# Patient Record
Sex: Male | Born: 1990 | Race: White | Hispanic: No | Marital: Single | Smoking: Never smoker
Health system: Southern US, Community
[De-identification: ages and names within clinical notes are randomized; demographics above are authoritative.]

## PROBLEM LIST (undated history)

## (undated) DIAGNOSIS — E669 Obesity, unspecified: Secondary | ICD-10-CM

## (undated) DIAGNOSIS — R569 Unspecified convulsions: Secondary | ICD-10-CM

## (undated) HISTORY — PX: BRAIN SURGERY: SHX531

## (undated) HISTORY — PX: TONSILLECTOMY: SUR1361

## (undated) HISTORY — PX: CHOLECYSTECTOMY: SHX55

## (undated) HISTORY — PX: LAPAROSCOPIC GASTRIC BANDING: SHX1100

---
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
## 2016-09-01 ENCOUNTER — Encounter (HOSPITAL_BASED_OUTPATIENT_CLINIC_OR_DEPARTMENT_OTHER): Payer: Self-pay | Admitting: *Deleted

## 2016-09-01 ENCOUNTER — Emergency Department (HOSPITAL_BASED_OUTPATIENT_CLINIC_OR_DEPARTMENT_OTHER)
Admission: EM | Admit: 2016-09-01 | Discharge: 2016-09-02 | Disposition: A | Discharge status: Home / Self Care | Payer: Medicaid Other | Attending: Emergency Medicine | Admitting: Emergency Medicine

## 2016-09-01 DIAGNOSIS — M25551 Pain in right hip: Secondary | ICD-10-CM | POA: Diagnosis present

## 2016-09-01 DIAGNOSIS — M67351 Transient synovitis, right hip: Secondary | ICD-10-CM | POA: Insufficient documentation

## 2016-09-01 DIAGNOSIS — R590 Localized enlarged lymph nodes: Secondary | ICD-10-CM | POA: Insufficient documentation

## 2016-09-01 DIAGNOSIS — M67359 Transient synovitis, unspecified hip: Secondary | ICD-10-CM

## 2016-09-01 HISTORY — DX: Unspecified convulsions: R56.9

## 2016-09-01 NOTE — ED Notes (Signed)
Pt c/o muscle aches in his hands, under his chin and in his bilateral legs for several days.  He last took ibuprofen yesterday.  Pt denies swelling or injury.

## 2016-09-01 NOTE — ED Triage Notes (Signed)
Pt c/o generalized pain and mutiple other complaints, seen at [facility] last week for same and then again this am at PMD office  DX virus

## 2016-09-02 MED ORDER — NAPROXEN 500 MG PO TABS: 500.0000 mg | ORAL_TABLET | Freq: Two times a day (BID) | ORAL | 0 refills | Status: DC

## 2016-09-02 MED ORDER — TRAMADOL HCL 50 MG PO TABS: 50.0000 mg | ORAL_TABLET | Freq: Four times a day (QID) | ORAL | 0 refills | Status: DC | PRN

## 2016-09-02 NOTE — Discharge Instructions (Signed)
Transient synovitis is an inflammation of the hip lining usually seen in children, but can occur in adults. It is an inflammation of the lining of the hip joint and can make walking and movement of the joint difficult.  Seek immediate medical attention if you are running high fevers, if you develop hot, red or swollen joints.

## 2016-09-02 NOTE — ED Notes (Signed)
Pt verbalizes understanding of d/c instructions and denies any further needs at this time. 

## 2016-09-02 NOTE — ED Provider Notes (Signed)
[facility]-EMERGENCY DEPT Provider Note   CSN: [id number] Arrival date & time: 09/01/16  2250     History   Chief Complaint Chief Complaint  Patient presents with  . Joint Pain    HPI Kyle Cruz is a 25 y.o. male who c/o myalgia. Patient is morbidly obese. Patient's symptoms began when he saw his pcp and had an ear infection. He was started on amoxil and developed diarrhaea and abdominal pain. He stopped his amoxil 4 days in . For the past week he has had pain in the BL hips that has been progressively worsening. He states that it is worse with movement and standing. Denies radicular symptoms. Worse with weight bearing.Denies fevers, chills, myalgias, arthralgias. Denies heat, redness or swelling. Denies DOE, SOB, chest tightness or pressure, radiation to left arm, jaw or back, or diaphoresis. Denies dysuria, flank pain, suprapubic pain, frequency, urgency, or hematuria. Denies headaches, light headedness, weakness, visual disturbances. Denies abdominal pain, nausea, vomiting, diarrhea or constipation. Patient has had several visits for the same complaint.   HPI  Past Medical History:  Diagnosis Date  . Seizures (HCC)     There are no active problems to display for this patient.   Past Surgical History:  Procedure Laterality Date  . BRAIN SURGERY    . CHOLECYSTECTOMY    . LAPAROSCOPIC GASTRIC BANDING    . TONSILLECTOMY         Home Medications    Prior to Admission medications   Medication Sig Start Date End Date Taking? Authorizing Provider  naproxen (NAPROSYN) 500 MG tablet Take 1 tablet (500 mg total) by mouth 2 (two) times daily with a meal. 09/02/16   Arthor CaptainAbigail Macklin Jacquin, PA-C  traMADol (ULTRAM) 50 MG tablet Take 1 tablet (50 mg total) by mouth every 6 (six) hours as needed. 09/02/16   Arthor CaptainAbigail Harvel Meskill, PA-C    Family History History reviewed. No pertinent family history.  Social History Social History  Substance Use Topics  . Smoking status: Never Smoker  .  Smokeless tobacco: Not on file  . Alcohol use No     Allergies   Patient has no known allergies.   Review of Systems Review of Systems Ten systems reviewed and are negative for acute change, except as noted in the HPI.    Physical Exam Updated Vital Signs BP (!) 173/102 (BP Location: Right Arm)   Pulse 104   Temp 98.4 F (36.9 C) (Oral)   Resp 18   Ht 5\' 7"  (1.702 m)   Wt (!) 159.8 kg   SpO2 99%   BMI 55.19 kg/m   Physical Exam  Constitutional: He appears well-developed and well-nourished. No distress.  HENT:  Head: Normocephalic and atraumatic.  Eyes: Conjunctivae are normal. No scleral icterus.  Neck: Normal range of motion. Neck supple.  Cardiovascular: Normal rate, regular rhythm and normal heart sounds.   Pulmonary/Chest: Effort normal and breath sounds normal. No respiratory distress.  Abdominal: Soft. There is no tenderness.  Musculoskeletal: He exhibits no edema, tenderness or deformity.  No erythema or swelling. Full Passive and active ROM of the R hip. Pain with movement. No ttp. Negative straigth leg test.   Neurological: He is alert.  Skin: Skin is warm and dry. He is not diaphoretic.  Psychiatric: His behavior is normal.  Nursing note and vitals reviewed.    ED Treatments / Results  Labs (all labs ordered are listed, but only abnormal results are displayed) Labs Reviewed - No data to display  EKG  EKG Interpretation None       Radiology No results found.  Procedures Procedures (including critical care time)  Medications Ordered in ED Medications - No data to display   Initial Impression / Assessment and Plan / ED Course  I have reviewed the triage vital signs and the nursing notes.  Pertinent labs & imaging results that were available during my care of the patient were reviewed by me and considered in my medical decision making (see chart for details).  Clinical Course    I suspect the patient's sxs are likely secondary to his  recent illness, perhaps a viral synovitis. He is ambulatory and there is no sign of septic joint. No evidence of bursitis, effusion, AVN.  Patient X-Ray negative for obvious fracture or dislocation. Pain managed in ED. Pt advised to follow up with orthopedics if symptoms persist for possibility of missed fracture diagnosis. Patient given brace while in ED, conservative therapy recommended and discussed. Patient will be dc home & is agreeable with above plan.   Final Clinical Impressions(s) / ED Diagnoses   Final diagnoses:  Transient synovitis of hip, unspecified laterality  Anterior cervical lymphadenopathy    New Prescriptions Discharge Medication List as of 09/02/2016  1:45 AM    START taking these medications   Details  naproxen (NAPROSYN) 500 MG tablet Take 1 tablet (500 mg total) by mouth 2 (two) times daily with a meal., Starting Tue 09/02/2016, Print    traMADol (ULTRAM) 50 MG tablet Take 1 tablet (50 mg total) by mouth every 6 (six) hours as needed., Starting Tue 09/02/2016, Print         Arthor CaptainAbigail Dorthey Depace, PA-C 09/07/16 1737    Paula LibraJohn Molpus, MD 09/07/16 2233

## 2016-12-27 ENCOUNTER — Emergency Department (HOSPITAL_BASED_OUTPATIENT_CLINIC_OR_DEPARTMENT_OTHER): Payer: Medicaid Other

## 2016-12-27 ENCOUNTER — Encounter (HOSPITAL_BASED_OUTPATIENT_CLINIC_OR_DEPARTMENT_OTHER): Payer: Self-pay | Admitting: Emergency Medicine

## 2016-12-27 DIAGNOSIS — Y999 Unspecified external cause status: Secondary | ICD-10-CM | POA: Insufficient documentation

## 2016-12-27 DIAGNOSIS — Y92007 Garden or yard of unspecified non-institutional (private) residence as the place of occurrence of the external cause: Secondary | ICD-10-CM | POA: Diagnosis not present

## 2016-12-27 DIAGNOSIS — S4992XA Unspecified injury of left shoulder and upper arm, initial encounter: Secondary | ICD-10-CM | POA: Diagnosis present

## 2016-12-27 DIAGNOSIS — Y9389 Activity, other specified: Secondary | ICD-10-CM | POA: Diagnosis not present

## 2016-12-27 DIAGNOSIS — S5012XA Contusion of left forearm, initial encounter: Secondary | ICD-10-CM | POA: Insufficient documentation
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
## 2016-12-27 NOTE — ED Notes (Signed)
Refuses ice pack in triage

## 2016-12-28 ENCOUNTER — Emergency Department (HOSPITAL_BASED_OUTPATIENT_CLINIC_OR_DEPARTMENT_OTHER)
Admission: EM | Admit: 2016-12-28 | Discharge: 2016-12-28 | Disposition: A | Discharge status: Home / Self Care | Payer: Medicaid Other | Attending: Emergency Medicine | Admitting: Emergency Medicine

## 2016-12-28 DIAGNOSIS — S50812A Abrasion of left forearm, initial encounter: Secondary | ICD-10-CM

## 2016-12-28 DIAGNOSIS — S5012XA Contusion of left forearm, initial encounter: Secondary | ICD-10-CM

## 2016-12-28 DIAGNOSIS — W19XXXA Unspecified fall, initial encounter: Secondary | ICD-10-CM

## 2016-12-28 HISTORY — DX: Obesity, unspecified: E66.9

## 2016-12-28 MED ORDER — NAPROXEN 500 MG PO TABS: 500.0000 mg | ORAL_TABLET | Freq: Two times a day (BID) | ORAL | 0 refills | Status: AC | PRN

## 2016-12-28 MED ORDER — NAPROXEN 250 MG PO TABS
500.0000 mg | ORAL_TABLET | Freq: Once | ORAL | Status: AC
  Administered 2016-12-28: 500 mg via ORAL
  Filled 2016-12-28: qty 2
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
## 2016-12-28 NOTE — ED Notes (Signed)
Pt and family given d/c instructions as per chart. Verbalizes understanding. No questions. 

## 2018-01-25 ENCOUNTER — Other Ambulatory Visit: Payer: Self-pay

## 2018-01-25 ENCOUNTER — Encounter (HOSPITAL_BASED_OUTPATIENT_CLINIC_OR_DEPARTMENT_OTHER): Payer: Self-pay | Admitting: *Deleted

## 2018-01-25 DIAGNOSIS — R600 Localized edema: Secondary | ICD-10-CM | POA: Insufficient documentation

## 2018-01-25 DIAGNOSIS — Z6841 Body Mass Index (BMI) 40.0 and over, adult: Secondary | ICD-10-CM | POA: Insufficient documentation

## 2018-01-25 DIAGNOSIS — Z9884 Bariatric surgery status: Secondary | ICD-10-CM | POA: Insufficient documentation

## 2018-01-25 DIAGNOSIS — R070 Pain in throat: Secondary | ICD-10-CM | POA: Insufficient documentation

## 2018-01-25 DIAGNOSIS — K432 Incisional hernia without obstruction or gangrene: Secondary | ICD-10-CM | POA: Insufficient documentation

## 2018-01-25 DIAGNOSIS — Z9049 Acquired absence of other specified parts of digestive tract: Secondary | ICD-10-CM | POA: Insufficient documentation

## 2018-01-25 DIAGNOSIS — K9509 Other complications of gastric band procedure: Secondary | ICD-10-CM | POA: Insufficient documentation

## 2018-01-25 LAB — URINALYSIS, ROUTINE W REFLEX MICROSCOPIC
Bilirubin Urine: NEGATIVE
GLUCOSE, UA: NEGATIVE mg/dL
HGB URINE DIPSTICK: NEGATIVE
Ketones, ur: NEGATIVE mg/dL
LEUKOCYTES UA: NEGATIVE
Nitrite: NEGATIVE
PH: 7 (ref 5.0–8.0)
Protein, ur: NEGATIVE mg/dL
Specific Gravity, Urine: <1.005 — ABNORMAL LOW (ref 1.005–1.030)

## 2018-01-25 LAB — CBC
HEMATOCRIT: 41.9 % (ref 39.0–52.0)
Hemoglobin: 14.3 g/dL (ref 13.0–17.0)
MCH: 28 pg (ref 26.0–34.0)
MCHC: 34.1 g/dL (ref 30.0–36.0)
MCV: 82.2 fL (ref 78.0–100.0)
Platelets: 299 10*3/uL (ref 150–400)
RBC: 5.1 MIL/uL (ref 4.22–5.81)
RDW: 14.1 % (ref 11.5–15.5)
WBC: 11.2 10*3/uL — ABNORMAL HIGH (ref 4.0–10.5)

## 2018-01-25 NOTE — ED Triage Notes (Signed)
He has had a knot with pain on his epigastric area for 3 months. Pain is worse after he eats. Symptoms started after having his lap band removed.

## 2018-01-25 NOTE — ED Triage Notes (Signed)
Blood was hemolyzed

## 2018-01-26 ENCOUNTER — Emergency Department (HOSPITAL_BASED_OUTPATIENT_CLINIC_OR_DEPARTMENT_OTHER): Payer: Self-pay

## 2018-01-26 ENCOUNTER — Emergency Department (HOSPITAL_BASED_OUTPATIENT_CLINIC_OR_DEPARTMENT_OTHER)
Admission: EM | Admit: 2018-01-26 | Discharge: 2018-01-26 | Disposition: A | Discharge status: Home / Self Care | Payer: Self-pay | Attending: Emergency Medicine | Admitting: Emergency Medicine

## 2018-01-26 ENCOUNTER — Emergency Department (HOSPITAL_COMMUNITY): Payer: Self-pay

## 2018-01-26 DIAGNOSIS — R609 Edema, unspecified: Secondary | ICD-10-CM

## 2018-01-26 DIAGNOSIS — K432 Incisional hernia without obstruction or gangrene: Secondary | ICD-10-CM

## 2018-01-26 LAB — COMPREHENSIVE METABOLIC PANEL
ALBUMIN: 4 g/dL (ref 3.5–5.0)
ALT: 69 U/L — AB (ref 17–63)
AST: 58 U/L — AB (ref 15–41)
Alkaline Phosphatase: 62 U/L (ref 38–126)
Anion gap: 9 (ref 5–15)
BILIRUBIN TOTAL: 1 mg/dL (ref 0.3–1.2)
BUN: 9 mg/dL (ref 6–20)
CO2: 26 mmol/L (ref 22–32)
CREATININE: 0.7 mg/dL (ref 0.61–1.24)
Calcium: 9.1 mg/dL (ref 8.9–10.3)
Chloride: 102 mmol/L (ref 101–111)
GFR calc Af Amer: >60 mL/min (ref >60)
GLUCOSE: 123 mg/dL — AB (ref 65–99)
POTASSIUM: 4.3 mmol/L (ref 3.5–5.1)
Sodium: 137 mmol/L (ref 135–145)
TOTAL PROTEIN: 6.9 g/dL (ref 6.5–8.1)

## 2018-01-26 LAB — LIPASE, BLOOD: LIPASE: 24 U/L (ref 11–51)

## 2018-01-26 LAB — RAPID STREP SCREEN (MED CTR MEBANE ONLY): Streptococcus, Group A Screen (Direct): NEGATIVE

## 2018-01-26 LAB — BRAIN NATRIURETIC PEPTIDE: B Natriuretic Peptide: 16.7 pg/mL (ref 0.0–100.0)

## 2018-01-26 LAB — TROPONIN I

## 2018-01-26 MED ORDER — FUROSEMIDE 20 MG PO TABS: 20.0000 mg | ORAL_TABLET | Freq: Every day | ORAL | 0 refills | Status: AC

## 2018-01-26 MED ORDER — IOPAMIDOL (ISOVUE-300) INJECTION 61%
INTRAVENOUS | Status: AC
  Administered 2018-01-26: 100 mL via INTRAVENOUS
  Filled 2018-01-26: qty 100

## 2018-01-26 MED ORDER — FUROSEMIDE 20 MG PO TABS: 20.0000 mg | ORAL_TABLET | Freq: Every day | ORAL | 0 refills | Status: DC

## 2018-01-26 MED ORDER — FUROSEMIDE 10 MG/ML IJ SOLN
40.0000 mg | Freq: Once | INTRAMUSCULAR | Status: AC
  Administered 2018-01-26: 40 mg via INTRAVENOUS
  Filled 2018-01-26: qty 4

## 2018-01-26 MED ORDER — DEXAMETHASONE 4 MG PO TABS
10.0000 mg | ORAL_TABLET | Freq: Once | ORAL | Status: AC
  Administered 2018-01-26: 10 mg via ORAL
  Filled 2018-01-26: qty 2

## 2018-01-26 MED ORDER — IOPAMIDOL (ISOVUE-300) INJECTION 61%: 150.0000 mL | Freq: Once | INTRAVENOUS | Status: DC | PRN

## 2018-01-26 NOTE — ED Provider Notes (Signed)
7:25 PM Assumed care from Drs. Floyd and Rancour, please see their note for full history, physical and decision making until this point. In brief this is a 27 y.o. year old male who presented to the ED tonight with Abdominal Pain  Sent here for ct of abdomen And pelvis secondary to epigastric pain. This is basically pain-free at this point however does have a fact containing hernia at the site of his previous surgery. Will discharge home with surgery follow up visit and pcp follow up as needed.     Discharge instructions, including strict return precautions for new or worsening symptoms, given. Patient and/or family verbalized understanding and agreement with the plan as described.   Labs, studies and imaging reviewed by myself and considered in medical decision making if ordered. Imaging interpreted by radiology.  Labs Reviewed  CBC - Abnormal; Notable for the following components:      Result Value   WBC 11.2 (*)    All other components within normal limits  URINALYSIS, ROUTINE W REFLEX MICROSCOPIC - Abnormal; Notable for the following components:   Specific Gravity, Urine <1.005 (*)    All other components within normal limits  COMPREHENSIVE METABOLIC PANEL - Abnormal; Notable for the following components:   Glucose, Bld 123 (*)    AST 58 (*)    ALT 69 (*)    All other components within normal limits  RAPID STREP SCREEN (NOT AT [facility])  CULTURE, GROUP A STREP ([facility])  LIPASE, BLOOD  BRAIN NATRIURETIC PEPTIDE  TROPONIN I    CT ABDOMEN PELVIS W CONTRAST  Final Result    DG Chest 2 View  Final Result      No follow-ups on file.    Marily MemosMesner, Jemell Town, MD 01/26/18 1925

## 2018-01-26 NOTE — ED Provider Notes (Signed)
[facility] EMERGENCY DEPARTMENT Provider Note   CSN: [id number] Arrival date & time: 01/25/18  2159     History   Chief Complaint Chief Complaint  Patient presents with  . Abdominal Pain    HPI Kyle Cruz is a 27 y.o. male.  Patient with multiple complaints.  States he had a sore throat for the past 1 week and pain with swallowing.  States he has had swelling in his legs worsening over the past 1 week as well.  He cannot wear regular shoes and must wear sandals.  He denies any chest pain or shortness of breath.  He denies any cough or fever.  He also complains of intermittent epigastric pain for the past 3 months that comes and goes.  States the pain is worse with eating and he sees a "knot" and he tries to eat.  He states this is happened every time he eats for the past 3 months.  Patient with history of laparoscopic gastric banding which was subsequently removed about 1.5 years ago.  He is also had a cholecystectomy.  He denies any diarrhea or vomiting.  Denies any pain with urination or blood in the urine.  He does not have a doctor.  The history is provided by the patient.  Abdominal Pain   Associated symptoms include nausea. Pertinent negatives include diarrhea, vomiting, dysuria, hematuria, headaches, arthralgias and myalgias.    Past Medical History:  Diagnosis Date  . Obesity   . Seizures (HCC)     There are no active problems to display for this patient.   Past Surgical History:  Procedure Laterality Date  . BRAIN SURGERY    . CHOLECYSTECTOMY    . LAPAROSCOPIC GASTRIC BANDING    . TONSILLECTOMY          Home Medications    Prior to Admission medications   Medication Sig Start Date End Date Taking? Authorizing Provider  naproxen (NAPROSYN) 500 MG tablet Take 1 tablet (500 mg total) by mouth 2 (two) times daily as needed (for pain). 12/28/16   Molpus, John, MD    Family History No family history on file.  Social History Social History    Tobacco Use  . Smoking status: Never Smoker  . Smokeless tobacco: Never Used  Substance Use Topics  . Alcohol use: No  . Drug use: No     Allergies   Patient has no known allergies.   Review of Systems Review of Systems  Constitutional: Negative for activity change and appetite change.  HENT: Positive for sore throat. Negative for congestion and postnasal drip.   Respiratory: Negative for chest tightness and shortness of breath.   Cardiovascular: Positive for leg swelling.  Gastrointestinal: Positive for abdominal pain and nausea. Negative for diarrhea and vomiting.  Genitourinary: Negative for dysuria and hematuria.  Musculoskeletal: Negative for arthralgias and myalgias.  Skin: Negative for wound.  Neurological: Negative for weakness and headaches.   all other systems are negative except as noted in the HPI and PMH.     Physical Exam Updated Vital Signs BP (!) 160/96 (BP Location: Right Arm)   Pulse (!) 104   Temp 98 F (36.7 C) (Oral)   Resp 12   Ht 5\' 5"  (1.651 m)   Wt (!) 170.1 kg (375 lb)   SpO2 99%   BMI 62.40 kg/m   Physical Exam  Constitutional: He is oriented to person, place, and time. He appears well-developed and well-nourished. No distress.  Morbidly obese  HENT:  Head: Normocephalic and atraumatic.  Mouth/Throat: Oropharynx is clear and moist. No oropharyngeal exudate.  Mild erythema, uvula midline, no exudates or asymmetry  Eyes: Pupils are equal, round, and reactive to light. Conjunctivae and EOM are normal.  Neck: Normal range of motion. Neck supple.  No meningismus.  Cardiovascular: Normal rate, regular rhythm, normal heart sounds and intact distal pulses.  No murmur heard. Pulmonary/Chest: Effort normal and breath sounds normal. No respiratory distress. He exhibits no tenderness.  Abdominal: Soft. There is tenderness. There is no rebound and no guarding.  Morbidly obese, epigastric tenderness.  No obvious hernia.  Questionable palpable  defect  Musculoskeletal: Normal range of motion. He exhibits edema. He exhibits no tenderness.  +2 edema to knees bilaterally. DP and PT pulses intact.  Compartments soft  Neurological: He is alert and oriented to person, place, and time. No cranial nerve deficit. He exhibits normal muscle tone. Coordination normal.  No ataxia on finger to nose bilaterally. No pronator drift. 5/5 strength throughout. CN 2-12 intact.Equal grip strength. Sensation intact.   Skin: Skin is warm.  Psychiatric: He has a normal mood and affect. His behavior is normal.  Nursing note and vitals reviewed.    ED Treatments / Results  Labs (all labs ordered are listed, but only abnormal results are displayed) Labs Reviewed  CBC - Abnormal; Notable for the following components:      Result Value   WBC 11.2 (*)    All other components within normal limits  URINALYSIS, ROUTINE W REFLEX MICROSCOPIC - Abnormal; Notable for the following components:   Specific Gravity, Urine <1.005 (*)    All other components within normal limits  COMPREHENSIVE METABOLIC PANEL - Abnormal; Notable for the following components:   Glucose, Bld 123 (*)    AST 58 (*)    ALT 69 (*)    All other components within normal limits  RAPID STREP SCREEN (NOT AT [facility])  CULTURE, GROUP A STREP [facility])  LIPASE, BLOOD  BRAIN NATRIURETIC PEPTIDE  TROPONIN I    EKG EKG Interpretation  Date/Time:  Monday January 25 2018 22:42:08 EDT Ventricular Rate:  105 PR Interval:  120 QRS Duration: 112 QT Interval:  360 QTC Calculation: 475 R Axis:   33 Text Interpretation:  Sinus tachycardia Incomplete right bundle branch block Borderline ECG No previous ECGs available Confirmed by Glynn Octave [phone]) on 01/26/2018 5:26:57 AM   Radiology Dg Chest 2 View  Result Date: 01/26/2018 CLINICAL DATA:  27 year old male with shortness of breath EXAM: CHEST - 2 VIEW COMPARISON:  Chest radiograph dated 07/28/2015 FINDINGS: There is mild cardiomegaly with  probable mild vascular congestion. No focal consolidation, pleural effusion, or pneumothorax. No acute osseous pathology. IMPRESSION: Mild cardiomegaly with possible mild vascular congestion. No focal consolidation. Electronically Signed   By: Elgie Collard M.D.   On: 01/26/2018 05:29    Procedures Procedures (including critical care time)  Medications Ordered in ED Medications  iopamidol (ISOVUE-300) 61 % injection 150 mL (has no administration in time range)     Initial Impression / Assessment and Plan / ED Course  I have reviewed the triage vital signs and the nursing notes.  Pertinent labs & imaging results that were available during my care of the patient were reviewed by me and considered in my medical decision making (see chart for details).    Epigastric pain at site of previous surgery.  Abdomen is soft and nontender.  Suspect possible incisional hernia.  Labs are reassuring.  LFTs and lipase are  normal. Mild congestion on chest x-ray noted.  BNP is normal however.  IV Lasix given  Suspect patient's leg edema is likely more to his size and CHF.  Low suspicion for PE or DVT.  Patient awaiting CT abdomen to further evaluate incisional hernia at shift change.  Low suspicion for strength related or incarcerated hernia.  Dr. Adela Lank to assume care at shift change as CT scan is not functioning at this time.  Final Clinical Impressions(s) / ED Diagnoses   Final diagnoses:  None    ED Discharge Orders    None       Mihira Tozzi, Jeannett Senior, MD 01/26/18 [phone]

## 2018-01-26 NOTE — ED Notes (Signed)
BIL pedal pulses heard by doppler.

## 2018-01-26 NOTE — ED Notes (Signed)
Bed: [facility] Expected date:  Expected time:  Means of arrival:  Comments: 

## 2018-01-26 NOTE — Discharge Instructions (Addendum)
You the need to establish care with a primary care physician.  Your swelling is likely due to your untreated sleep apnea as well as your weight.  Follow-up with your surgeon regarding your incisional hernia.  Return to the ED if you develop chest pain, shortness of breath or any other concerns.  [facility].  They should be waiting for you there to get your CT scan.

## 2018-01-26 NOTE — ED Provider Notes (Addendum)
I received the patient in signout from Dr. Manus Gunningancour, briefly the patient is a 27 year old male with a chief complaint of epigastric abdominal pain and a bulge.  Plan was for a CT scan of the abdomen pelvis with contrast.  Unfortunately the CT scanner is not functioning and therefore I will have the [facility].  I discussed this with the patient. Discussed with Dr. Clayborne DanaMesner who accepts in transfer      Melene PlanFloyd, Finnegan Gatta, [place]DO 01/26/18 [phone]

## 2018-01-26 NOTE — ED Notes (Signed)
Patient transported to X-ray 

## 2018-01-28 LAB — CULTURE, GROUP A STREP (THRC)

## 2019-05-26 ENCOUNTER — Emergency Department (HOSPITAL_COMMUNITY): Payer: Self-pay

## 2019-05-26 ENCOUNTER — Encounter (HOSPITAL_COMMUNITY): Payer: Self-pay | Admitting: Emergency Medicine

## 2019-05-26 ENCOUNTER — Other Ambulatory Visit: Payer: Self-pay

## 2019-05-26 ENCOUNTER — Emergency Department (HOSPITAL_COMMUNITY)
Admission: EM | Admit: 2019-05-26 | Discharge: 2019-05-26 | Disposition: A | Discharge status: Home / Self Care | Payer: Self-pay | Attending: Emergency Medicine | Admitting: Emergency Medicine

## 2019-05-26 DIAGNOSIS — R102 Pelvic and perineal pain: Secondary | ICD-10-CM | POA: Insufficient documentation

## 2019-05-26 DIAGNOSIS — R1110 Vomiting, unspecified: Secondary | ICD-10-CM | POA: Insufficient documentation

## 2019-05-26 DIAGNOSIS — K529 Noninfective gastroenteritis and colitis, unspecified: Secondary | ICD-10-CM | POA: Insufficient documentation

## 2019-05-26 DIAGNOSIS — R197 Diarrhea, unspecified: Secondary | ICD-10-CM | POA: Insufficient documentation

## 2019-05-26 LAB — COMPREHENSIVE METABOLIC PANEL
ALT: 48 U/L — ABNORMAL HIGH (ref 0–44)
AST: 45 U/L — ABNORMAL HIGH (ref 15–41)
Albumin: 4.1 g/dL (ref 3.5–5.0)
Alkaline Phosphatase: 68 U/L (ref 38–126)
Anion gap: 9 (ref 5–15)
BUN: 5 mg/dL — ABNORMAL LOW (ref 6–20)
CO2: 28 mmol/L (ref 22–32)
Calcium: 9.1 mg/dL (ref 8.9–10.3)
Chloride: 102 mmol/L (ref 98–111)
Creatinine, Ser: 0.83 mg/dL (ref 0.61–1.24)
GFR calc Af Amer: >60 mL/min (ref >60)
GFR calc non Af Amer: >60 mL/min (ref >60)
Glucose, Bld: 100 mg/dL — ABNORMAL HIGH (ref 70–99)
Potassium: 3.7 mmol/L (ref 3.5–5.1)
Sodium: 139 mmol/L (ref 135–145)
Total Bilirubin: 1.1 mg/dL (ref 0.3–1.2)
Total Protein: 7.7 g/dL (ref 6.5–8.1)

## 2019-05-26 LAB — URINALYSIS, ROUTINE W REFLEX MICROSCOPIC
Bilirubin Urine: NEGATIVE
Glucose, UA: NEGATIVE mg/dL
Hgb urine dipstick: NEGATIVE
Ketones, ur: NEGATIVE mg/dL
Leukocytes,Ua: NEGATIVE
Nitrite: NEGATIVE
Protein, ur: NEGATIVE mg/dL
Specific Gravity, Urine: 1.02 (ref 1.005–1.030)
pH: 6 (ref 5.0–8.0)

## 2019-05-26 LAB — CBC
HCT: 46.8 % (ref 39.0–52.0)
Hemoglobin: 15.1 g/dL (ref 13.0–17.0)
MCH: 27.3 pg (ref 26.0–34.0)
MCHC: 32.3 g/dL (ref 30.0–36.0)
MCV: 84.6 fL (ref 80.0–100.0)
Platelets: 384 10*3/uL (ref 150–400)
RBC: 5.53 MIL/uL (ref 4.22–5.81)
RDW: 13.3 % (ref 11.5–15.5)
WBC: 11.5 10*3/uL — ABNORMAL HIGH (ref 4.0–10.5)
nRBC: 0 % (ref 0.0–0.2)

## 2019-05-26 LAB — LIPASE, BLOOD: Lipase: 25 U/L (ref 11–51)

## 2019-05-26 MED ORDER — METRONIDAZOLE 500 MG PO TABS: 500.0000 mg | ORAL_TABLET | Freq: Three times a day (TID) | ORAL | 0 refills | Status: AC

## 2019-05-26 MED ORDER — CIPROFLOXACIN HCL 500 MG PO TABS: 500.0000 mg | ORAL_TABLET | Freq: Two times a day (BID) | ORAL | 0 refills | Status: AC

## 2019-05-26 MED ORDER — IOHEXOL 300 MG/ML  SOLN
100.0000 mL | Freq: Once | INTRAMUSCULAR | Status: AC | PRN
  Administered 2019-05-26: 100 mL via INTRAVENOUS

## 2019-05-26 MED ORDER — SODIUM CHLORIDE (PF) 0.9 % IJ SOLN
INTRAMUSCULAR | Status: AC
  Filled 2019-05-26: qty 50

## 2019-05-26 MED ORDER — ONDANSETRON HCL 4 MG/2ML IJ SOLN: 4.0000 mg | Freq: Once | INTRAMUSCULAR | Status: DC | PRN

## 2019-05-26 MED ORDER — SODIUM CHLORIDE 0.9% FLUSH: 3.0000 mL | Freq: Once | INTRAVENOUS | Status: DC

## 2019-05-26 MED ORDER — SODIUM CHLORIDE 0.9 % IV BOLUS
1000.0000 mL | Freq: Once | INTRAVENOUS | Status: AC
  Administered 2019-05-26: 1000 mL via INTRAVENOUS

## 2019-05-26 NOTE — Discharge Instructions (Signed)
Begin taking Cipro and Flagyl as prescribed.  Follow-up with [facility] gastroenterology next week.  The contact information for their office has been provided in this discharge summary for you to call and make these arrangements.  Return to the emergency department for worsening abdominal pain, worsening bleeding, high fever, or other new and concerning symptoms.

## 2019-05-26 NOTE — ED Notes (Signed)
Patient states his complains of nausea and difficulty urinating since the beginning of July. Since then he is still nauseated, but his urinary symptoms have resolved.

## 2019-05-26 NOTE — ED Provider Notes (Signed)
[facility]-EMERGENCY DEPT Provider Note   CSN: [id number] Arrival date & time: 05/26/19  2049     History   Chief Complaint Chief Complaint  Patient presents with  . Abdominal Pain  . Nausea  . Emesis    HPI Kyle Cruz is a 28 y.o. male.     Patient is a 28 year old male with history of morbid obesity, prior craniectomy, prior lap band placement and removal.  He presents today for evaluation of abdominal pain and loose stools.  This is been worsening over the past month.  He describes daily loose stools that are occasionally tinged with blood.  He is having discomfort in the right lower quadrant and suprapubic region.  He denies any fevers or chills.  He denies any ill contacts.  The history is provided by the patient.  Abdominal Pain Pain location:  Suprapubic and RLQ Pain quality: cramping   Pain radiates to:  Does not radiate Pain severity:  Moderate Duration:  3 weeks Timing:  Intermittent Progression:  Worsening Chronicity:  New Relieved by:  Nothing Worsened by:  Nothing Ineffective treatments:  None tried Associated symptoms: vomiting   Emesis Associated symptoms: abdominal pain     Past Medical History:  Diagnosis Date  . Obesity   . Seizures (HCC)     There are no active problems to display for this patient.   Past Surgical History:  Procedure Laterality Date  . BRAIN SURGERY    . CHOLECYSTECTOMY    . LAPAROSCOPIC GASTRIC BANDING    . TONSILLECTOMY          Home Medications    Prior to Admission medications   Medication Sig Start Date End Date Taking? Authorizing Provider  furosemide (LASIX) 20 MG tablet Take 1 tablet (20 mg total) by mouth daily. 01/26/18   Mesner, Barbara CowerJason, MD  naproxen (NAPROSYN) 500 MG tablet Take 1 tablet (500 mg total) by mouth 2 (two) times daily as needed (for pain). 12/28/16   Molpus, Jonny RuizJohn, MD    Family History History reviewed. No pertinent family history.  Social History Social  History   Tobacco Use  . Smoking status: Never Smoker  . Smokeless tobacco: Never Used  Substance Use Topics  . Alcohol use: No  . Drug use: No     Allergies   Patient has no known allergies.   Review of Systems Review of Systems  Gastrointestinal: Positive for abdominal pain and vomiting.  All other systems reviewed and are negative.    Physical Exam Updated Vital Signs BP 134/88 (BP Location: Left Arm)   Pulse 78   Temp 98.4 F (36.9 C) (Oral)   Resp 16   Ht 5\' 7"  (1.702 m)   Wt (!) 181.4 kg   SpO2 99%   BMI 62.65 kg/m   Physical Exam Vitals signs and nursing note reviewed.  Constitutional:      General: He is not in acute distress.    Appearance: He is well-developed. He is not diaphoretic.  HENT:     Head: Normocephalic and atraumatic.  Neck:     Musculoskeletal: Normal range of motion and neck supple.  Cardiovascular:     Rate and Rhythm: Normal rate and regular rhythm.     Heart sounds: No murmur. No friction rub.  Pulmonary:     Effort: Pulmonary effort is normal. No respiratory distress.     Breath sounds: Normal breath sounds. No wheezing or rales.  Abdominal:     General: Bowel sounds  are normal. There is no distension.     Palpations: Abdomen is soft.     Tenderness: There is abdominal tenderness in the right lower quadrant and suprapubic area. There is no right CVA tenderness, left CVA tenderness, guarding or rebound.  Musculoskeletal: Normal range of motion.  Skin:    General: Skin is warm and dry.  Neurological:     Mental Status: He is alert and oriented to person, place, and time.     Coordination: Coordination normal.      ED Treatments / Results  Labs (all labs ordered are listed, but only abnormal results are displayed) Labs Reviewed  LIPASE, BLOOD  COMPREHENSIVE METABOLIC PANEL  CBC  URINALYSIS, ROUTINE W REFLEX MICROSCOPIC    EKG None  Radiology No results found.  Procedures Procedures (including critical care time)   Medications Ordered in ED Medications  sodium chloride flush (NS) 0.9 % injection 3 mL (has no administration in time range)  ondansetron (ZOFRAN) injection 4 mg (has no administration in time range)  sodium chloride 0.9 % bolus 1,000 mL (has no administration in time range)     Initial Impression / Assessment and Plan / ED Course  I have reviewed the triage vital signs and the nursing notes.  Pertinent labs & imaging results that were available during my care of the patient were reviewed by me and considered in my medical decision making (see chart for details).  Patient presenting with a several week history of loose stools and lower abdominal discomfort.  Patient has a slight leukocytosis and CT scan showing what appears to be colitis, likely infectious in etiology.  Patient will be treated with Cipro and Flagyl and is to follow-up with gastroenterology next week.  Final Clinical Impressions(s) / ED Diagnoses   Final diagnoses:  None    ED Discharge Orders    None       Veryl Speak, MD 05/26/19 2310

## 2019-05-26 NOTE — ED Notes (Signed)
Patient transported to CT via stretcher.

## 2019-05-26 NOTE — ED Triage Notes (Signed)
Patient is complaining of left and right lower abdominal pain. Patient states that he feels nauseas, has had diarhea, and some trouble urinating.

## 2019-12-03 IMAGING — CT CT ABDOMEN AND PELVIS WITH CONTRAST
2 of 4 series · 16 of 46 positions shown, 18 images · IV contrast (ISOVUE)
Comparison: None.

CLINICAL DATA: Right and left lower quadrant pain. Nausea.

EXAM:
CT ABDOMEN AND PELVIS WITH CONTRAST
TECHNIQUE: Multidetector CT imaging of the abdomen and pelvis was performed
using the standard protocol following bolus administration of
intravenous contrast.
CONTRAST:  100mL OMNIPAQUE IOHEXOL 300 MG/ML  SOLN

[Series 2: axial st · axial · 0.97mm/px · z∈[+1177,+1602]mm · 13 of 95 slices shown, 15 images]
[im 5/95  soft-tissue]
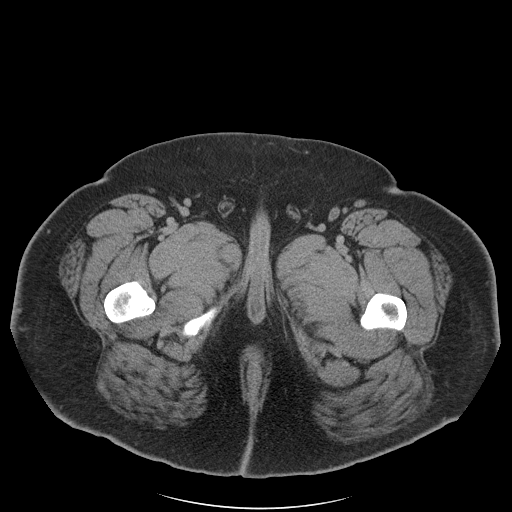
[im 5/95  bone]
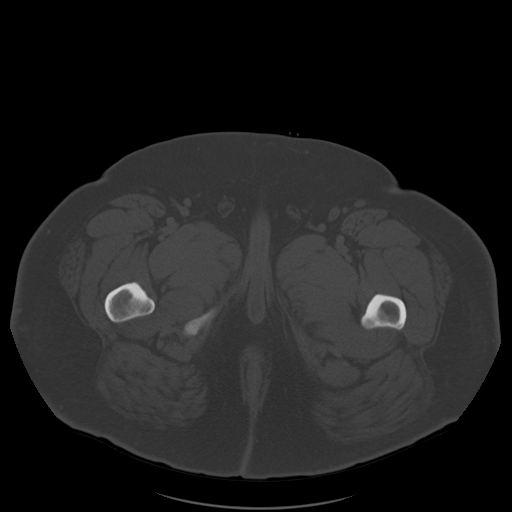
[im 15/95  soft-tissue]
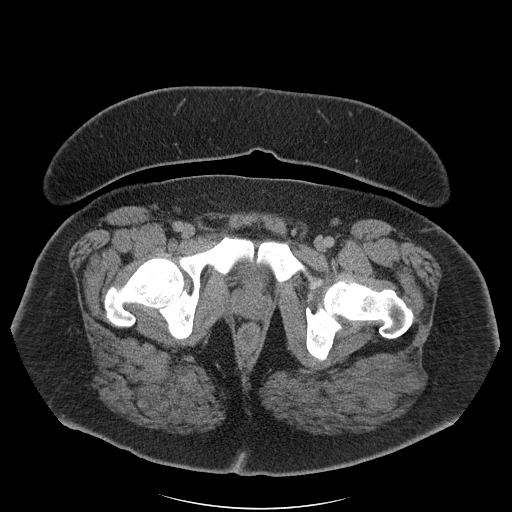
[im 20/95  soft-tissue]
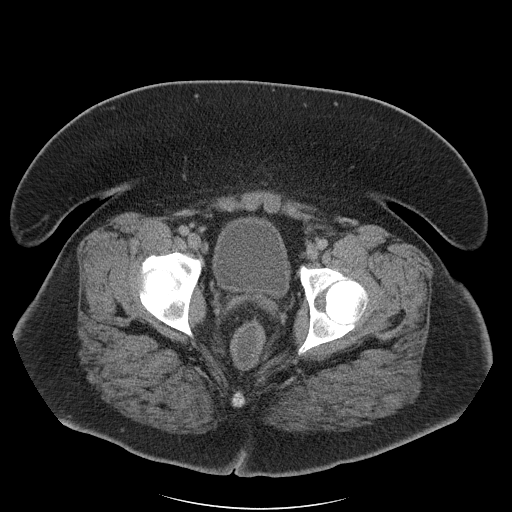
[im 25/95  soft-tissue]
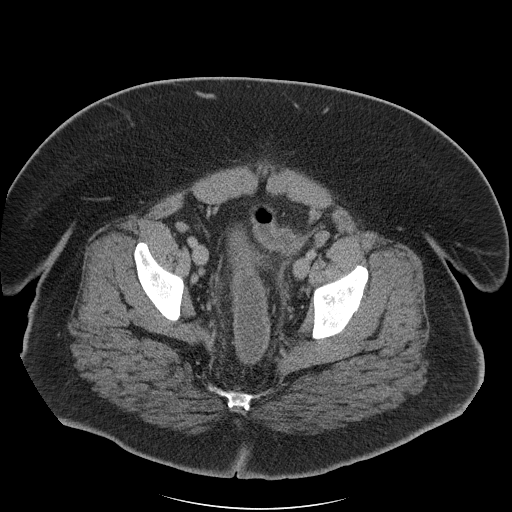
[im 35/95  soft-tissue]
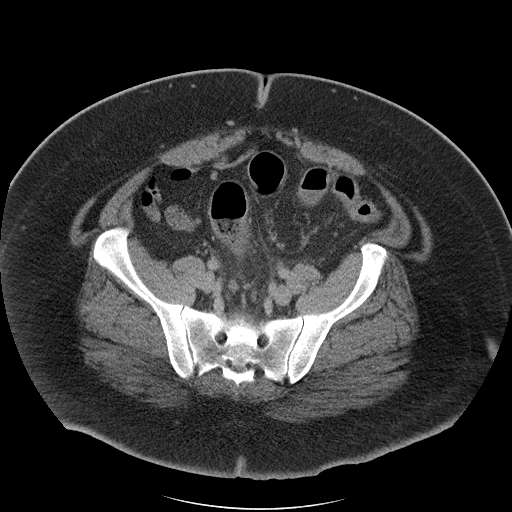
[im 40/95  soft-tissue]
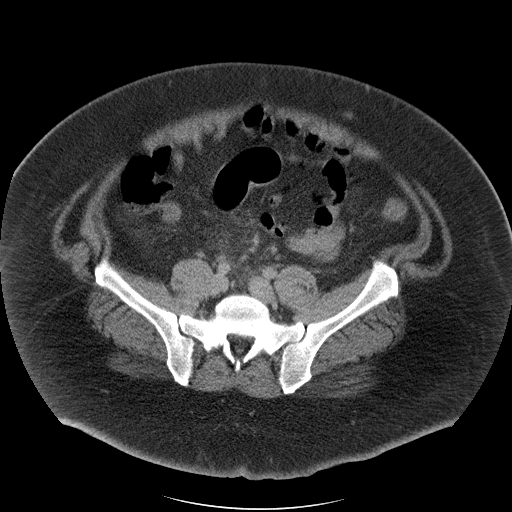
[im 50/95  soft-tissue]
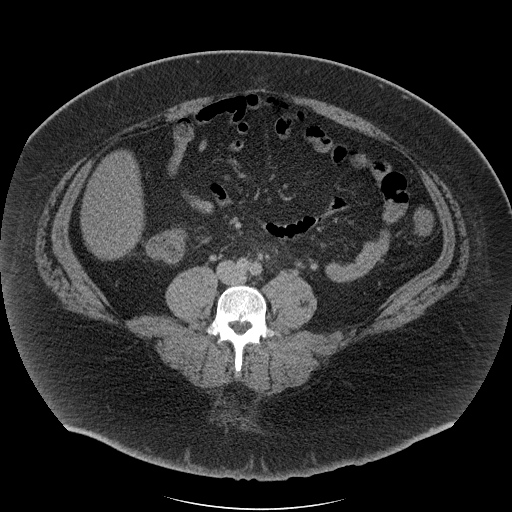
[im 55/95  soft-tissue]
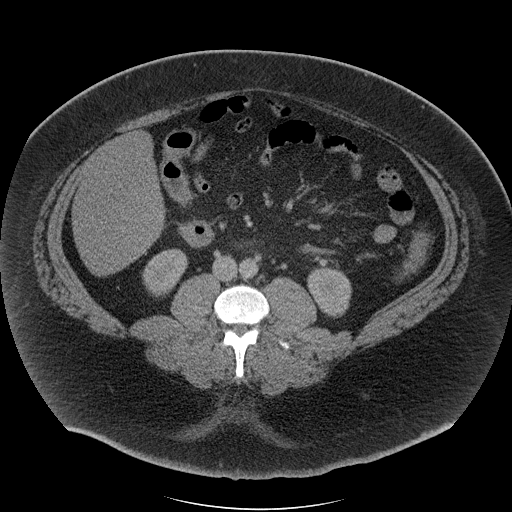
[im 60/95  soft-tissue]
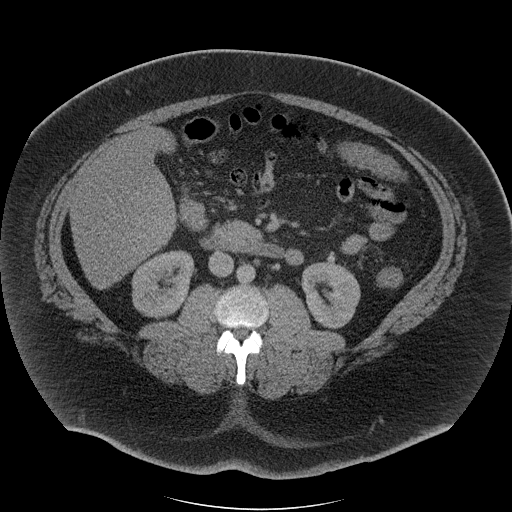
[im 60/95  bone]
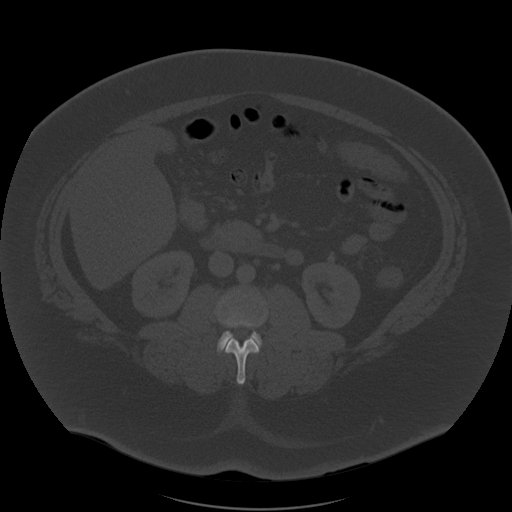
[im 70/95  soft-tissue]
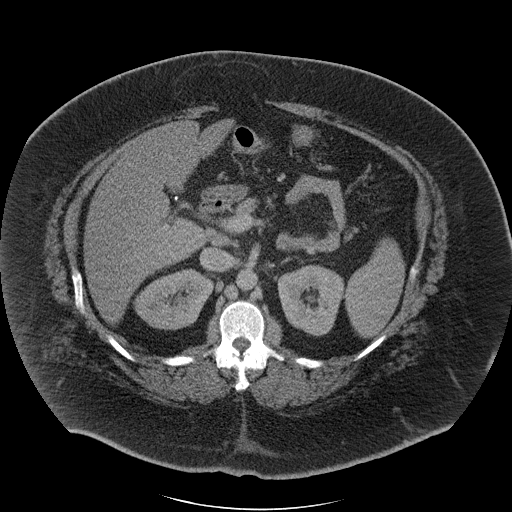
[im 75/95  soft-tissue]
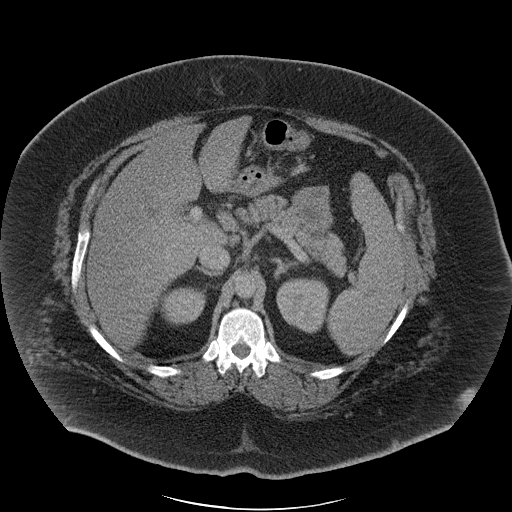
[im 80/95  soft-tissue]
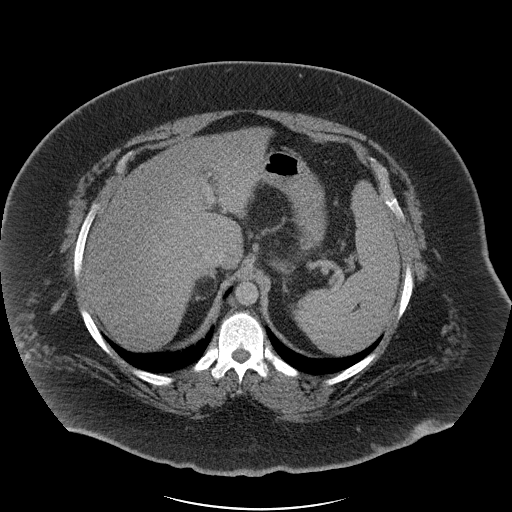
[im 90/95  soft-tissue]
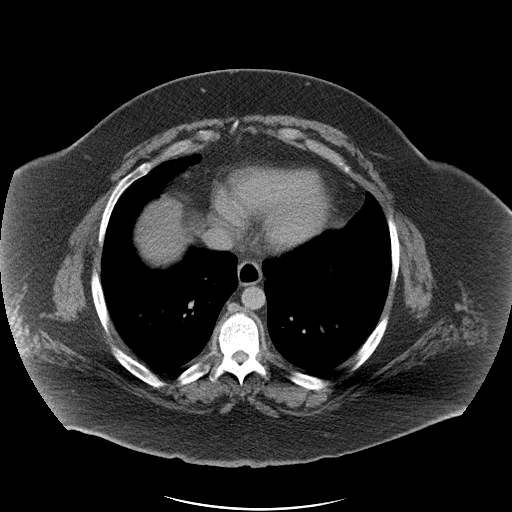

[Series 5: coronal st · coronal · 0.90mm/px · 3 of 194 slices shown]
[im 65/194  soft-tissue]
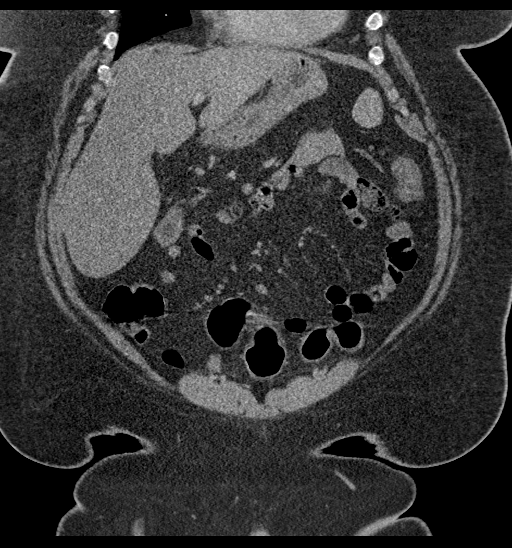
[im 86/194  soft-tissue]
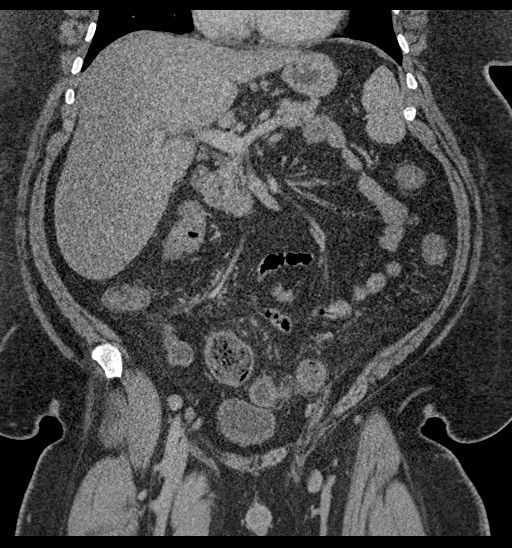
[im 108/194  soft-tissue]
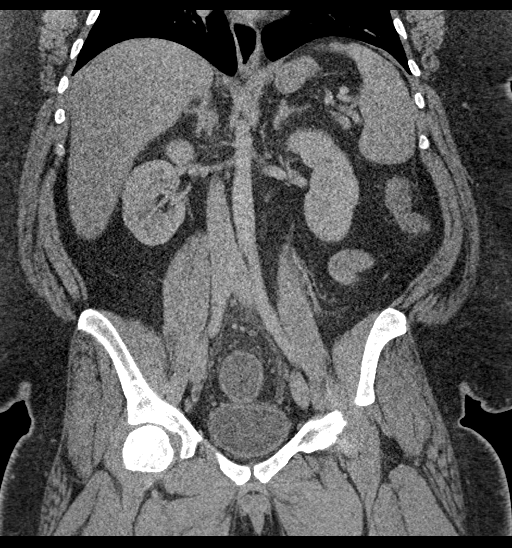

[16 of 46 positions shown; findings below may reference images not displayed]

FINDINGS: Lower chest: Subsegmental atelectasis or scarring in the medial
right lower lobe.

Hepatobiliary: Enlarged liver with steatosis. Liver spans 22.8 cm
cranial caudal. Insert:

Pancreas: No ductal dilatation or inflammation.

Spleen: Enlarged spanning 18 cm AP. Small central low densities are
unchanged from prior exam, nonspecific but typically benign.

Adrenals/Urinary Tract: Unchanged 2.7 cm right adrenal myelolipoma.
Left adrenal gland is normal. No hydronephrosis or perinephric
edema. Homogeneous renal enhancement. Urinary bladder is partially
distended without wall thickening.

Stomach/Bowel: There is pericolonic edema about the distal
transverse, descending, and sigmoid colon. Minimal associated wall
thickening of the descending and proximal sigmoid. Formed stool in
the distal sigmoid colon and rectum. No small bowel inflammation,
wall thickening or obstruction. Normal appendix, for example image
67 series 2. Stomach is nondistended.

Vascular/Lymphatic: Increased number of multiple small lymph nodes
throughout the abdomen and pelvis, including colonic mesentery,
retroperitoneum, bilateral external iliac and ileocolic chain. These
all remain subcentimeter. No acute vascular findings. Normal caliber
abdominal aorta. Portal vein is patent.

Reproductive: Prostate is unremarkable.

Other: Supraumbilical ventral abdominal wall hernia containing only
fat. Hernia neck measures 4.3 cm as before. No inflammatory change
or bowel involvement. No free air or free fluid.

Musculoskeletal: There are no acute or suspicious osseous
abnormalities.
IMPRESSION: 1. Colitis involving the distal transverse, descending, and sigmoid
colon. This may be infectious or inflammatory.
2. Hepatosplenomegaly and hepatic steatosis.
3. Unchanged fat containing supraumbilical ventral abdominal wall
hernia.
4. Unchanged right adrenal myelolipoma.

## 2023-04-24 ENCOUNTER — Emergency Department (HOSPITAL_BASED_OUTPATIENT_CLINIC_OR_DEPARTMENT_OTHER)
Admission: EM | Admit: 2023-04-24 | Discharge: 2023-04-24 | Disposition: A | Discharge status: Home / Self Care | Payer: Medicaid Other | Attending: Emergency Medicine | Admitting: Emergency Medicine

## 2023-04-24 ENCOUNTER — Encounter (HOSPITAL_BASED_OUTPATIENT_CLINIC_OR_DEPARTMENT_OTHER): Payer: Self-pay

## 2023-04-24 ENCOUNTER — Other Ambulatory Visit: Payer: Self-pay

## 2023-04-24 DIAGNOSIS — R112 Nausea with vomiting, unspecified: Secondary | ICD-10-CM | POA: Insufficient documentation

## 2023-04-24 DIAGNOSIS — R42 Dizziness and giddiness: Secondary | ICD-10-CM

## 2023-04-24 LAB — BASIC METABOLIC PANEL
Anion gap: 11 (ref 5–15)
BUN: 10 mg/dL (ref 6–20)
CO2: 26 mmol/L (ref 22–32)
Calcium: 10.1 mg/dL (ref 8.9–10.3)
Chloride: 100 mmol/L (ref 98–111)
Creatinine, Ser: 0.84 mg/dL (ref 0.61–1.24)
GFR, Estimated: >60 mL/min (ref >60)
Glucose, Bld: 123 mg/dL — ABNORMAL HIGH (ref 70–99)
Potassium: 4.5 mmol/L (ref 3.5–5.1)
Sodium: 137 mmol/L (ref 135–145)

## 2023-04-24 LAB — CBC
HCT: 45.8 % (ref 39.0–52.0)
Hemoglobin: 15.4 g/dL (ref 13.0–17.0)
MCH: 27.3 pg (ref 26.0–34.0)
MCHC: 33.6 g/dL (ref 30.0–36.0)
MCV: 81.1 fL (ref 80.0–100.0)
Platelets: 318 10*3/uL (ref 150–400)
RBC: 5.65 MIL/uL (ref 4.22–5.81)
RDW: 13.8 % (ref 11.5–15.5)
WBC: 12 10*3/uL — ABNORMAL HIGH (ref 4.0–10.5)
nRBC: 0 % (ref 0.0–0.2)

## 2023-04-24 LAB — CBG MONITORING, ED: Glucose-Capillary: 106 mg/dL — ABNORMAL HIGH (ref 70–99)

## 2023-04-24 MED ORDER — MECLIZINE HCL 25 MG PO TABS
50.0000 mg | ORAL_TABLET | Freq: Once | ORAL | Status: AC
  Administered 2023-04-24: 50 mg via ORAL
  Filled 2023-04-24: qty 2

## 2023-04-24 MED ORDER — MECLIZINE HCL 25 MG PO TABS: 25.0000 mg | ORAL_TABLET | Freq: Three times a day (TID) | ORAL | 0 refills | Status: AC | PRN

## 2023-04-24 NOTE — Discharge Instructions (Addendum)
You have been seen today for your complaint of dizziness. Your lab work was overall reassuring. Your discharge medications include meclizine.  This is a medicine use to help with your symptoms.  Take it as needed. Home care instructions are as follows:  Drink plenty of water.  Change positions slowly. Follow up with: A primary care provider of your choice in 1 week for reevaluation Please seek immediate medical care if you develop any of the following symptoms: Have difficulty speaking or moving. Are always dizzy or faint. Develop severe headaches. Have weakness in your legs or arms. Have changes in your hearing or vision. Develop a stiff neck. Develop sensitivity to light. At this time there does not appear to be the presence of an emergent medical condition, however there is always the potential for conditions to change. Please read and follow the below instructions.  Do not take your medicine if  develop an itchy rash, swelling in your mouth or lips, or difficulty breathing; call 911 and seek immediate emergency medical attention if this occurs.  You may review your lab tests and imaging results in their entirety on your MyChart account.  Please discuss all results of fully with your primary care provider and other specialist at your follow-up visit.  Note: Portions of this text may have been transcribed using voice recognition software. Every effort was made to ensure accuracy; however, inadvertent computerized transcription errors may still be present.

## 2023-04-24 NOTE — ED Provider Notes (Signed)
[facility] EMERGENCY DEPARTMENT AT [facility] Provider Note   CSN: [id number] Arrival date & time: 04/24/23  1857     History  Chief Complaint  Patient presents with   Dizziness   Nausea    Kyle Cruz is a 32 y.o. male.  With a history of seizure disorder who presents to the ED for evaluation of dizziness.  He states this began approximately 10:30 PM last night.  He states it feels like the room is spinning.  It is significantly worse when he is moving.  It goes away when he is resting.  He reports the dizziness has caused some nausea and vomiting as well.  He denies any headaches, neck pain or stiffness, vision changes, numbness, weakness, tingling, chest pain, shortness of breath, abdominal pain, fevers, chills.  He denies any recent illnesses.   Dizziness      Home Medications Prior to Admission medications   Medication Sig Start Date End Date Taking? Authorizing Provider  meclizine (ANTIVERT) 25 MG tablet Take 1 tablet (25 mg total) by mouth 3 (three) times daily as needed for dizziness. 04/24/23  Yes Ebonee Stober, Edsel Petrin, PA-C  ciprofloxacin (CIPRO) 500 MG tablet Take 1 tablet (500 mg total) by mouth 2 (two) times daily. One po bid x 7 days 05/26/19   Geoffery Lyons, MD  furosemide (LASIX) 20 MG tablet Take 1 tablet (20 mg total) by mouth daily. Patient not taking: Reported on 05/26/2019 01/26/18   Mesner, Barbara Cower, MD  metroNIDAZOLE (FLAGYL) 500 MG tablet Take 1 tablet (500 mg total) by mouth 3 (three) times daily. One po bid x 7 days 05/26/19   Geoffery Lyons, MD  naproxen (NAPROSYN) 500 MG tablet Take 1 tablet (500 mg total) by mouth 2 (two) times daily as needed (for pain). Patient not taking: Reported on 05/26/2019 12/28/16   Molpus, Jonny Ruiz, MD      Allergies    Patient has no known allergies.    Review of Systems   Review of Systems  Neurological:  Positive for dizziness.  All other systems reviewed and are negative.   Physical Exam Updated Vital Signs BP  (!) 142/95 (BP Location: Left Arm) Comment: Simultaneous filing. User may not have seen previous data.  Pulse 77 Comment: Simultaneous filing. User may not have seen previous data.  Temp 98.2 F (36.8 C)   Resp 16 Comment: Simultaneous filing. User may not have seen previous data.  Ht 5\' 7"  (1.702 m)   Wt (!) 190 kg   SpO2 97% Comment: Simultaneous filing. User may not have seen previous data.  BMI 65.60 kg/m  Physical Exam Vitals and nursing note reviewed.  Constitutional:      General: He is not in acute distress.    Appearance: He is well-developed.  HENT:     Head: Normocephalic and atraumatic.     Comments: Serous effusion of the right middle ear.  Left TM normal Eyes:     Conjunctiva/sclera: Conjunctivae normal.  Cardiovascular:     Rate and Rhythm: Normal rate and regular rhythm.     Heart sounds: No murmur heard. Pulmonary:     Effort: Pulmonary effort is normal. No respiratory distress.     Breath sounds: Normal breath sounds.  Abdominal:     Palpations: Abdomen is soft.     Tenderness: There is no abdominal tenderness.  Musculoskeletal:        General: No swelling.     Cervical back: Neck supple.  Skin:    General:  Skin is warm and dry.     Capillary Refill: Capillary refill takes less than 2 seconds.  Neurological:     General: No focal deficit present.     Mental Status: He is alert and oriented to person, place, and time.     Comments:   MENTAL STATUS: AAOx3   LANG/SPEECH: Fluent, intact naming, repetition & comprehension   CRANIAL NERVES:   II: Pupils equal and reactive   III, IV, VI: EOM intact, no gaze preference or deviation, no nystagmus   V: normal sensation of the face   VII: no facial asymmetry   VIII: normal hearing to speech   MOTOR: 5/5 in both upper and lower extremities   SENSORY: Normal to touch in all extremiteis   COORD: Normal finger to nose, heel to shin and shoulder shrug, no tremor, no dysmetria. No pronator drift.  Negative Romberg.   Normal gait.  Psychiatric:        Mood and Affect: Mood normal.     ED Results / Procedures / Treatments   Labs (all labs ordered are listed, but only abnormal results are displayed) Labs Reviewed  BASIC METABOLIC PANEL - Abnormal; Notable for the following components:      Result Value   Glucose, Bld 123 (*)    All other components within normal limits  CBC - Abnormal; Notable for the following components:   WBC 12.0 (*)    All other components within normal limits  CBG MONITORING, ED - Abnormal; Notable for the following components:   Glucose-Capillary 106 (*)    All other components within normal limits  CBG MONITORING, ED    EKG EKG Interpretation  Date/Time:  Friday April 24 2023 19:06:12 EDT Ventricular Rate:  88 PR Interval:  150 QRS Duration: 110 QT Interval:  380 QTC Calculation: 459 R Axis:   34 Text Interpretation: Normal sinus rhythm Incomplete right bundle branch block Borderline ECG When compared with ECG of 25-Jan-2018 22:42, No significant change was found Confirmed by Meridee Score [id number]) on 04/24/2023 7:27:19 PM  Radiology No results found.  Procedures Procedures    Medications Ordered in ED Medications  meclizine (ANTIVERT) tablet 50 mg (50 mg Oral Given 04/24/23 2151)    ED Course/ Medical Decision Making/ A&P Clinical Course as of 04/24/23 2303  Fri Apr 24, 2023  2211 Patient reported improvement in his symptoms after meclizine. [AS]    Clinical Course User Index [AS] Lula Olszewski Edsel Petrin, PA-C                             Medical Decision Making Amount and/or Complexity of Data Reviewed Labs: ordered.  This patient presents to the ED for concern of dizziness, this involves an extensive number of treatment options, and is a complaint that carries with it a high risk of complications and morbidity.  The emergent differential diagnosis for acute vertigo low includes peripheral causes such as BPPV, barotrauma, ear foreign body, Mnire's  disease, infectious causes such as lip bronchitis, vestibular neuritis or Ramsay Hunt syndrome.  Other emergent causes are central such as cerebellar stroke, vertebrobasilar insufficiency, neoplastic causes, vertebral artery dissection, MS, neurosyphilis or tuberculosis, epilepsy or migraine.  Other causes include anemia, hyperviscosity syndrome, alcohol or aminoglycoside use, renal failure, hypoglycemia and thyroid disease.   My initial workup includes basic labs  Additional history obtained from: Nursing notes from this visit. Family sister is at bedside and provides a portion of the  history  I ordered, reviewed and interpreted labs which include: BMP, CBC.  Hyperglycemia of 123, mild leukocytosis of 12.0.  Afebrile, hemodynamically stable.  32 year old male presenting to the ED for evaluation of dizziness.  This began yesterday evening.  He appears very well on physical exam.  Neurologic exam is unremarkable.  Lab workup is reassuring as well.  His symptoms are present with movement and improved with rest.  He reported improvement in his symptoms as well with meclizine and was able to ambulate in the ED without dizziness or nausea.  Overall I suspect BPPV as the cause of his symptoms.  Low suspicion for central causes of his vertigo.  He was sent a prescription for meclizine.  He was encouraged to follow-up with a primary care provider within the next week for reevaluation of his symptoms.  He was given strict return precautions and voices understanding.  Stable at the time of admission.  At this time there does not appear to be any evidence of an acute emergency medical condition and the patient appears stable for discharge with appropriate outpatient follow up. Diagnosis was discussed with patient who verbalizes understanding of care plan and is agreeable to discharge. I have discussed return precautions with patient and sister who verbalizes understanding. Patient encouraged to follow-up with their  PCP within 1 week. All questions answered.  Note: Portions of this report may have been transcribed using voice recognition software. Every effort was made to ensure accuracy; however, inadvertent computerized transcription errors may still be present.        Final Clinical Impression(s) / ED Diagnoses Final diagnoses:  Vertigo    Rx / DC Orders ED Discharge Orders          Ordered    meclizine (ANTIVERT) 25 MG tablet  3 times daily PRN        04/24/23 2301              Michelle Piper, PA-C 04/24/23 2303    Terrilee Files, MD 04/25/23 1025

## 2023-04-24 NOTE — ED Notes (Signed)
Reviewed AVS/discharge instruction with patient. Time allotted for and all questions answered. Patient is agreeable for d/c and escorted to ed exit by staff.  

## 2023-04-24 NOTE — ED Triage Notes (Addendum)
Patient arrives with complaints of dizziness and nausea that started last night at 10:30pm. Patient states that it worsened today and he feels like he is leaning to the left.  Strength is equal in both arms and legs. No facial droop. Vision is normal.   Reports no pain.
